# Patient Record
Sex: Male | Born: 1965 | Race: White | Hispanic: No | Marital: Single | Smoking: Never smoker
Health system: Southern US, Community
[De-identification: ages and names within clinical notes are randomized; demographics above are authoritative.]

## PROBLEM LIST (undated history)

## (undated) DIAGNOSIS — N200 Calculus of kidney: Secondary | ICD-10-CM

## (undated) HISTORY — PX: TONSILLECTOMY: SUR1361

## (undated) HISTORY — PX: HAND SURGERY: SHX662

## (undated) HISTORY — DX: Calculus of kidney: N20.0

---

## 2020-12-07 ENCOUNTER — Ambulatory Visit (INDEPENDENT_AMBULATORY_CARE_PROVIDER_SITE_OTHER): Payer: 59

## 2020-12-07 ENCOUNTER — Other Ambulatory Visit: Payer: Self-pay

## 2020-12-07 ENCOUNTER — Ambulatory Visit
Admission: EM | Admit: 2020-12-07 | Discharge: 2020-12-07 | Disposition: A | Discharge status: Home / Self Care | Payer: 59 | Attending: Physician Assistant | Admitting: Physician Assistant

## 2020-12-07 ENCOUNTER — Encounter: Payer: Self-pay | Admitting: Emergency Medicine

## 2020-12-07 DIAGNOSIS — R1011 Right upper quadrant pain: Secondary | ICD-10-CM | POA: Diagnosis present

## 2020-12-07 DIAGNOSIS — K760 Fatty (change of) liver, not elsewhere classified: Secondary | ICD-10-CM | POA: Diagnosis not present

## 2020-12-07 DIAGNOSIS — R0781 Pleurodynia: Secondary | ICD-10-CM

## 2020-12-07 DIAGNOSIS — R61 Generalized hyperhidrosis: Secondary | ICD-10-CM | POA: Insufficient documentation

## 2020-12-07 LAB — CBC WITH DIFFERENTIAL/PLATELET
Abs Immature Granulocytes: 0.04 10*3/uL (ref 0.00–0.07)
Basophils Absolute: 0.1 10*3/uL (ref 0.0–0.1)
Basophils Relative: 0 %
Eosinophils Absolute: 0.2 10*3/uL (ref 0.0–0.5)
Eosinophils Relative: 1 %
HCT: 51.6 % (ref 39.0–52.0)
Hemoglobin: 17.7 g/dL — ABNORMAL HIGH (ref 13.0–17.0)
Immature Granulocytes: 0 %
Lymphocytes Relative: 19 %
Lymphs Abs: 2.5 10*3/uL (ref 0.7–4.0)
MCH: 29.9 pg (ref 26.0–34.0)
MCHC: 34.3 g/dL (ref 30.0–36.0)
MCV: 87.2 fL (ref 80.0–100.0)
Monocytes Absolute: 1.4 10*3/uL — ABNORMAL HIGH (ref 0.1–1.0)
Monocytes Relative: 11 %
Neutro Abs: 9 10*3/uL — ABNORMAL HIGH (ref 1.7–7.7)
Neutrophils Relative %: 69 %
Platelets: 287 10*3/uL (ref 150–400)
RBC: 5.92 MIL/uL — ABNORMAL HIGH (ref 4.22–5.81)
RDW: 12.2 % (ref 11.5–15.5)
WBC: 13.1 10*3/uL — ABNORMAL HIGH (ref 4.0–10.5)
nRBC: 0 % (ref 0.0–0.2)

## 2020-12-07 LAB — COMPREHENSIVE METABOLIC PANEL
ALT: 30 U/L (ref 0–44)
AST: 21 U/L (ref 15–41)
Albumin: 4.7 g/dL (ref 3.5–5.0)
Alkaline Phosphatase: 63 U/L (ref 38–126)
Anion gap: 10 (ref 5–15)
BUN: 14 mg/dL (ref 6–20)
CO2: 27 mmol/L (ref 22–32)
Calcium: 9.6 mg/dL (ref 8.9–10.3)
Chloride: 97 mmol/L — ABNORMAL LOW (ref 98–111)
Creatinine, Ser: 0.89 mg/dL (ref 0.61–1.24)
GFR, Estimated: >60 mL/min (ref >60)
Glucose, Bld: 128 mg/dL — ABNORMAL HIGH (ref 70–99)
Potassium: 4 mmol/L (ref 3.5–5.1)
Sodium: 134 mmol/L — ABNORMAL LOW (ref 135–145)
Total Bilirubin: 1 mg/dL (ref 0.3–1.2)
Total Protein: 8.9 g/dL — ABNORMAL HIGH (ref 6.5–8.1)

## 2020-12-07 LAB — LIPASE, BLOOD: Lipase: 53 U/L — ABNORMAL HIGH (ref 11–51)

## 2020-12-07 MED ORDER — KETOROLAC TROMETHAMINE 10 MG PO TABS: 10.0000 mg | ORAL_TABLET | Freq: Four times a day (QID) | ORAL | 0 refills | Status: DC | PRN

## 2020-12-07 NOTE — ED Triage Notes (Signed)
Pt is present today with night sweats and also rib pain. Pt states that he is having a had time taking a deep breath due to right side rib pain Pt states that he noticed the pain Sunday night.

## 2020-12-07 NOTE — Discharge Instructions (Addendum)
Your x-ray was normal today. The ultrasound of your upper abdomen is negative for gallstones, but you do have a fatty liver. Try to stop drinking alcohol and reduce fat and carbs in your diet. You will likely get cholesterol check and check for diabetes with new PCP. If you keep having night sweats, may need thyroid checked as well/  The labs are all reassuring. Slightly elevated white blood cell count so that is why we obtained the ultrasound. If your pain worsens or you develop a fever or vomiting, dark stools, weakness, you should go to the ED. Otherwise please follow up with your PCP next month. It is symptom control at this time. I have sent something for pain. Rest and increase your fluids as well.

## 2020-12-07 NOTE — ED Provider Notes (Signed)
[facility]    CSN: [id number] Arrival date & time: 12/07/20  [id number]      History   Chief Complaint No chief complaint on file.   HPI Ray Snyder is a 55 y.o. male presenting for 3 night history of right-sided rib pain that radiates to the right upper back as well as night sweats and cramping of his chest and upper abdomen.  Patient says that his symptoms mostly only occur at night, but he has the "right-sided rib pain" during the day as well.  He denies any associated fevers or fatigue.  He denies any coughing or difficulty breathing.  He says that the pain of the ribs is made worse when he takes deep breaths.  He says he has tried to do some stretches to relieve the discomfort but it does not really help.  No increased pain to palpation of this area.  No vomiting, diarrhea or constipation. No lower back pain, dysuria, urinary frequency, flank pain.  No sick contacts.  Has not taken any OTC medications to help with the discomfort.  Patient denies history of asthma or COPD.  Non-smoker.  No history of cardiac issues or GI problems either.  No other concerns.  HPI  History reviewed. No pertinent past medical history.  There are no problems to display for this patient.   History reviewed. No pertinent surgical history.     Home Medications    Prior to Admission medications   Medication Sig Start Date End Date Taking? Authorizing Provider  ketorolac (TORADOL) 10 MG tablet Take 1 tablet (10 mg total) by mouth every 6 (six) hours as needed. 12/07/20  Yes Shirlee LatchEaves, Addley Ballinger B, PA-C    Family History History reviewed. No pertinent family history.  Social History Social History   Tobacco Use  . Smoking status: Never Smoker  . Smokeless tobacco: Never Used  Substance Use Topics  . Alcohol use: Yes    Comment: rarely   . Drug use: Never     Allergies   Codeine   Review of Systems Review of Systems  Constitutional: Positive for diaphoresis. Negative for fatigue and  fever.  HENT: Negative for congestion, rhinorrhea, sinus pressure, sinus pain and sore throat.   Respiratory: Negative for cough, shortness of breath and wheezing.   Cardiovascular: Positive for chest pain. Negative for palpitations.  Gastrointestinal: Positive for abdominal pain and nausea. Negative for diarrhea and vomiting.  Genitourinary: Negative for dysuria, flank pain and hematuria.  Musculoskeletal: Positive for back pain (upper back). Negative for myalgias.  Neurological: Negative for weakness, light-headedness and headaches.  Hematological: Negative for adenopathy.     Physical Exam Triage Vital Signs ED Triage Vitals  Enc Vitals Group     BP 12/07/20 0837 (!) 158/94     Pulse Rate 12/07/20 0837 71     Resp 12/07/20 0837 17     Temp 12/07/20 0837 98.4 F (36.9 C)     Temp Source 12/07/20 0837 Oral     SpO2 12/07/20 0837 98 %     Weight --      Height --      Head Circumference --      Peak Flow --      Pain Score 12/07/20 0834 8     Pain Loc --      Pain Edu? --      Excl. in GC? --    No data found.  Updated Vital Signs BP (!) 158/94 (BP Location: Left Arm)  Pulse 71   Temp 98.4 F (36.9 C) (Oral)   Resp 17   SpO2 98%       Physical Exam Vitals and nursing note reviewed.  Constitutional:      General: He is not in acute distress.    Appearance: Normal appearance. He is well-developed. He is not ill-appearing or diaphoretic.  HENT:     Head: Normocephalic and atraumatic.     Mouth/Throat:     Pharynx: Uvula midline.     Tonsils: No tonsillar abscesses.  Eyes:     General: No scleral icterus.       Right eye: No discharge.        Left eye: No discharge.     Conjunctiva/sclera: Conjunctivae normal.  Neck:     Thyroid: No thyromegaly.     Trachea: No tracheal deviation.  Cardiovascular:     Rate and Rhythm: Normal rate and regular rhythm.     Heart sounds: Normal heart sounds.  Pulmonary:     Effort: Pulmonary effort is normal. No respiratory  distress.     Breath sounds: Normal breath sounds. No wheezing, rhonchi or rales.  Chest:     Chest wall: No tenderness.  Abdominal:     General: Bowel sounds are normal.     Palpations: Abdomen is soft.     Tenderness: There is abdominal tenderness in the right upper quadrant and epigastric area. There is no right CVA tenderness or left CVA tenderness. Positive signs include Murphy's sign.  Musculoskeletal:     Cervical back: Normal range of motion and neck supple.  Lymphadenopathy:     Cervical: No cervical adenopathy.  Skin:    General: Skin is warm and dry.     Findings: No rash.  Neurological:     General: No focal deficit present.     Mental Status: He is alert. Mental status is at baseline.     Motor: No weakness.     Gait: Gait normal.  Psychiatric:        Mood and Affect: Mood normal.        Behavior: Behavior normal.        Thought Content: Thought content normal.      UC Treatments / Results  Labs (all labs ordered are listed, but only abnormal results are displayed) Labs Reviewed  CBC WITH DIFFERENTIAL/PLATELET - Abnormal; Notable for the following components:      Result Value   WBC 13.1 (*)    RBC 5.92 (*)    Hemoglobin 17.7 (*)    Neutro Abs 9.0 (*)    Monocytes Absolute 1.4 (*)    All other components within normal limits  COMPREHENSIVE METABOLIC PANEL - Abnormal; Notable for the following components:   Sodium 134 (*)    Chloride 97 (*)    Glucose, Bld 128 (*)    Total Protein 8.9 (*)    All other components within normal limits  LIPASE, BLOOD - Abnormal; Notable for the following components:   Lipase 53 (*)    All other components within normal limits    EKG   Radiology DG Ribs Unilateral W/Chest Right  Result Date: 12/07/2020 CLINICAL DATA:  Right-sided rib pain and night sweats.  No injury. EXAM: RIGHT RIBS AND CHEST - 3+ VIEW COMPARISON:  None. FINDINGS: No fracture or other bone lesions are seen involving the ribs. There is no evidence of  pneumothorax or pleural effusion. Both lungs are clear. Heart size and mediastinal contours are within normal  limits. IMPRESSION: Negative. Electronically Signed   By: Obie Dredge M.D.   On: 12/07/2020 09:23   US Abdomen Limited RUQ (LIVER/GB)  Result Date: 12/07/2020 CLINICAL DATA:  Right upper quadrant pain for 4 days. EXAM: ULTRASOUND ABDOMEN LIMITED RIGHT UPPER QUADRANT COMPARISON:  None. FINDINGS: Gallbladder: No gallstones or wall thickening visualized. No sonographic Murphy sign noted by sonographer. Common bile duct: Diameter: 3 mm, within normal limits. Liver: Diffusely increased parenchymal echogenicity, consistent with hepatic steatosis. Focal fatty sparing seen adjacent to the gallbladder fossa. However, there is another hypoechoic lesion in the posterior right lobe which measures 2.0 x 1.5 by 1.9 cm, and does not have characteristics of a simple cyst. Portal vein is patent on color Doppler imaging with normal direction of blood flow towards the liver. Other: None. IMPRESSION: No evidence of cholelithiasis or biliary ductal dilatation. Diffuse hepatic steatosis. 2 cm indeterminate hypoechoic liver lesion in posterior right hepatic lobe. Abdomen MRI without and with contrast is recommended for further characterization. Electronically Signed   By: Danae Orleans M.D.   On: 12/07/2020 11:58    Procedures Procedures (including critical care time)  Medications Ordered in UC Medications - No data to display  Initial Impression / Assessment and Plan / UC Course  I have reviewed the triage vital signs and the nursing notes.  Pertinent labs & imaging results that were available during my care of the patient were reviewed by me and considered in my medical decision making (see chart for details).   55 year old male presenting for right sided rib pain and night sweats as well as nausea and muscle cramping at night.  Blood pressure is elevated 158/94.  He is afebrile and well-appearing.  Exam is  significant for right upper quadrant tenderness and positive Murphy sign.  No tenderness to palpation of chest or back.  No CVA tenderness. Heart is regular rate and rhythm and chest is clear to auscultation.  No skin rashes that could be consistent with shingles.  Right rib x-ray and chest x-ray obtained today due to concern for pneumonia, pleural effusion, small pneumothorax. Images reviewed by me.  X-rays are normal.  CBC, CMP and lipase obtained.  Concerned about possible cholecystitis, cholelithiasis and pancreatitis given his right upper quadrant and epigastric tenderness as well as positive Murphy sign.  WBC is slightly elevated at 13.1.  Lipase is 53 which is just slightly above normal.  Additionally he does have elevated glucose at 128.  Patient says that he has not eaten eaten anything today.  Ultrasound shows hepatic steatosis. Patient says he used to drink alcohol heavily but does not anymore. Drinks every now and then now.  Advised him to try to stop drinking completely and to also watch his diet.  Advised avoiding intake of fatty foods and excess carbs.  I am sure he will have lipid panel performed at his upcoming appointment.  Reviewed [place] results with patient. Advised no cholecystitis/cholelithiasis.  Also reviewed the lab results.  Since he has not eaten anything today and does have a elevated glucose, advised that it is possible he could have underlying diabetes or prediabetes.  He has an upcoming appointment with Dr. Judithann Graves next month so advised him to have lab work done at that time.  Reviewed that the lipase is slightly elevated so if he has any increased epigastric pain then he needs to go to the emergency department.  Reviewed ED precautions for any worsening symptoms.  Advised to go to the ED if he develops any  increased abdominal pain, fever, weakness or any new symptoms such as lower back pain or flank pain or urinary symptoms.  It is possible this could be an abnormal  presentation for a kidney stone.  Also concerning is the night sweats.  This could be due to infection or possibly even endocrine problem.  He does have appt with PCP Dr. Judithann Graves at the end of next month. Advised to keep that and see if he can get in sooner.   Supportive care at this time. Sent ketorolac for pain.  Also advised that he could take low-dose Tylenol and apply ice to the area that hurts.  This could all be musculoskeletal pain.  Nonetheless, I did caution him on going to ED for any worsening symptoms.   Final Clinical Impressions(s) / UC Diagnoses   Final diagnoses:  Abdominal pain, RUQ (right upper quadrant)  Night sweats  Fatty liver  Rib pain on right side     Discharge Instructions     Your x-ray was normal today. The ultrasound of your upper abdomen is negative for gallstones, but you do have a fatty liver. Try to stop drinking alcohol and reduce fat and carbs in your diet. You will likely get cholesterol check and check for diabetes with new PCP. If you keep having night sweats, may need thyroid checked as well/  The labs are all reassuring. Slightly elevated white blood cell count so that is why we obtained the ultrasound. If your pain worsens or you develop a fever or vomiting, dark stools, weakness, you should go to the ED. Otherwise please follow up with your PCP next month. It is symptom control at this time. I have sent something for pain. Rest and increase your fluids as well.     ED Prescriptions    Medication Sig Dispense Auth. Provider   ketorolac (TORADOL) 10 MG tablet Take 1 tablet (10 mg total) by mouth every 6 (six) hours as needed. 20 tablet Shirlee Latch, PA-C     I have reviewed the PDMP during this encounter.   Shirlee Latch, PA-C 12/07/20 1623

## 2020-12-21 DIAGNOSIS — N201 Calculus of ureter: Secondary | ICD-10-CM | POA: Insufficient documentation

## 2020-12-21 DIAGNOSIS — R911 Solitary pulmonary nodule: Secondary | ICD-10-CM | POA: Insufficient documentation

## 2020-12-21 HISTORY — PX: CYSTOURETHROSCOPY: SHX476

## 2021-01-12 ENCOUNTER — Telehealth: Payer: Self-pay

## 2021-01-12 NOTE — Telephone Encounter (Signed)
Copied from CRM [id number]. Topic: General - Other >> Jan 12, 2021 10:02 AM Daphine Deutscher D wrote: Reason for CRM: Raynelle Fanning from [facility] radiology called with a report for patient.    CXR DOS May 19th  Findings,  Rt lower lung mass.  Left lower lung nodule.  Recommending chest CT to follow.   They are faxing his report and have notified pt of findings and FU.     CB#  Raynelle Fanning [phone]

## 2021-01-12 NOTE — Telephone Encounter (Signed)
Note is in pt chart. Pt is not  established yet. Pt has a new pt appt 01/31/2021.  KP

## 2021-01-31 ENCOUNTER — Encounter: Payer: Self-pay | Admitting: Internal Medicine

## 2021-01-31 ENCOUNTER — Other Ambulatory Visit: Payer: Self-pay

## 2021-01-31 ENCOUNTER — Ambulatory Visit (INDEPENDENT_AMBULATORY_CARE_PROVIDER_SITE_OTHER): Payer: 59 | Admitting: Internal Medicine

## 2021-01-31 VITALS — BP 136/80 | HR 63 | Temp 98.1°F | Ht 72.0 in | Wt 217.0 lb

## 2021-01-31 DIAGNOSIS — R918 Other nonspecific abnormal finding of lung field: Secondary | ICD-10-CM | POA: Diagnosis not present

## 2021-01-31 DIAGNOSIS — R911 Solitary pulmonary nodule: Secondary | ICD-10-CM | POA: Diagnosis not present

## 2021-01-31 DIAGNOSIS — Z87442 Personal history of urinary calculi: Secondary | ICD-10-CM

## 2021-01-31 NOTE — Progress Notes (Signed)
Date:  01/31/2021   Name:  Ray Snyder   DOB:  [birth year]   MRN:  [id number]   Chief Complaint: Establish Care (X 3 weeks ago lung mass/liver lesion from urologist went to UC and had Pneumonia, went to hospital [facility] and coughed up blood while in hospital they may have found something in liver, kidney stones )  HPI He currently feels well.  He has not coughed up any more blood.  He denies SOB or cough.  He does have intermittent discomfort between his shoulder blades.  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 He removed his ureteral stents.  He will follow up with Urology on 02/10/21 for KUB and [place].  They have also arranged for a repeat CT of the chest at the same time.  It has already been approved by his insurance. He just needs to call and find out what time to arrive at Radiology at [facility]. He has not had a PCP or CPX in many years.  Due for Colonoscopy.  12/23/20 Discharge note from [facility].  Summary.  # Hemoptysis # Concern for left lower lobe pneumonia # Mass like opacity in R lower lobe Additional mass like opacity seen on CXR. Given his infection symptoms over the past two weeks, I continue to favor this is due to pneumonia, possibly post-viral, given severe symptoms 2 weeks ago. Recommend completing CAP course for treatment and with close f/u with CT chest imaging. Hemoptysis has since resolved.  - can discharge on amoxicillin 1 gm tid and azithromycin 500 mg daily to complete a total of 5 days course - recommend close f/u CT chest in a few weeks given also new R opacity as well  - PCP f/u - has one arranged for June 28th outside [facility]. I have placed an ASAP PCP referral to expedite sooner follow up.  - okay to discharge from medical standpoint.  CT Abd/Pelvic 12/20/20 at [facility]: CT abdomen and pelvis with IV contrast  Comparison:  None available.  Indication:  Abdominal pain, acute, nonlocalized, R10.32 Left lower  quadrant pain.  Technique:  CT imaging was performed of  the abdomen and pelvis following  the administration of intravenous contrast.  Iodinated contrast was used  due to the indications for the examination, to improve disease detection  and further define anatomy. Coronal and sagittal reformatted images were  generated and reviewed.  Findings:  - Lower Thorax: Nodular opacity in the left lower lung (3/22) measuring 2.5  x 1.9 cm. No pleural or pericardial effusions.  - Liver: Liver cyst. No suspicious liver lesions. Additional scattered  hypoattenuating foci in the liver which are too small to characterize.  Hepatic veins are patent. Portal veins are patent.  - Biliary and Gallbladder: No intrahepatic or extrahepatic bile duct  dilatation. Normal gallbladder.  - Spleen: Normal spleen.  - Pancreas: Normal pancreas.  - Adrenal Glands: Normal adrenal glands.  - Kidneys: Mild delayed left nephrogram. Bilateral renal cysts. No  suspicious renal lesions. Tiny 2 mm obstructing stone in the distal left  ureter (3/1 3 3) with mild upstream hydroureteronephrosis and periureteral  stranding. There is a small amount of fluid surrounding the proximal ureter  on the initial images, and the patient was brought back for delayed images  which demonstrate contrast extravasation into the left retroperitoneum near  the region of the left ureteropelvic junction (UPJ) (12/66).  - Abdominal and Pelvic Vasculature: No abdominal aortic aneurysm. Mild  vascular calcification.  -  Gastrointestinal Tract: No abnormal dilation or wall thickening.  Diverticulosis without diverticulitis. Normal appendix.  - Peritoneum/Mesentery/Retroperitoneum: No free fluid.  No free  intraperitoneal air.  - Lymph Nodes: No retroperitoneal, mesenteric, pelvic, or inguinal  lymphadenopathy.    - Bladder: Normal bladder.  - Pelvic Organs: Unremarkable.  - Body Wall: Unremarkable.  - Musculoskeletal:  No aggressive appearing osseous lesions.  Impression:  1. Approximately 2 mm  obstructing stone in the distal left ureter  complicated by mild left hydroureteronephrosis and extravasation of  contrast from the proximal ureteropelvic junction (UPJ) region. Small  volume urine in the left retroperitoneum, which is increased on the delayed  images compared with the initial images. No definite contrast is seen in  the distal left ureter beyond the stone.  2. Focal consolidation in the left lung base which may be infectious in  etiology. Recommend 3 month Chest CT follow up after treatment to exclude  neoplasm.   Findings discussed with PA Wolick by Donley Redder, MD, [facility]  at 12/20/2020 2:21 PM. Discussion of delayed images confirming rupture  discussed at 3:30 PM.    CXR 12/22/20 at [facility]: Procedure: Frontal and lateral views of the chest.  Indication: Lung Aeration, R91.1 Solitary pulmonary nodule  Comparison: None.  Findings and impression:  1. Masslike opacity projecting in the superior segment of the right lower  lobe. Differential considerations include infection and neoplasm.  2. Irregular nodular opacity seen in the left lower lobe on recent  abdominal CT not well visualized on this radiograph.  3. No evidence of pneumothorax or pleural effusion.  4. Cardiac and mediastinal contours within normal limits.  UNEXPECTED FINDING: Masslike opacity projecting in the right lung, in  addition to faintly identified left lower lobe nodular opacity identified  on previous abdominal CT. As the left lower lobe lesion is poorly seen on  radiography, a follow-up CT of the chest is recommended, either now for  full evaluation of the right lung opacity or, possibly, after treatment for  infection.  Electronically Signed by:  Dierdre Forth, MD, [facility]  Electronically Signed on:  12/23/2020 8:10 AM Lab Results  Component Value Date   CREATININE 0.89 12/07/2020   BUN 14 12/07/2020   NA 134 (L) 12/07/2020   K 4.0 12/07/2020   CL 97 (L) 12/07/2020    CO2 27 12/07/2020   No results found for: CHOL, HDL, LDLCALC, LDLDIRECT, TRIG, CHOLHDL No results found for: TSH No results found for: HGBA1C Lab Results  Component Value Date   WBC 13.1 (H) 12/07/2020   HGB 17.7 (H) 12/07/2020   HCT 51.6 12/07/2020   MCV 87.2 12/07/2020   PLT 287 12/07/2020   Lab Results  Component Value Date   ALT 30 12/07/2020   AST 21 12/07/2020   ALKPHOS 63 12/07/2020   BILITOT 1.0 12/07/2020     Review of Systems  Constitutional:  Negative for appetite change, fatigue, fever and unexpected weight change.  Respiratory:  Negative for cough, chest tightness, shortness of breath and wheezing.   Cardiovascular:  Positive for chest pain (intermittent pain between shoulder blades). Negative for palpitations and leg swelling.  Gastrointestinal:  Negative for abdominal pain, blood in stool, constipation and diarrhea.  Genitourinary:  Negative for dysuria, flank pain and hematuria.  Musculoskeletal:  Positive for arthralgias (in wrists s/p fall recently with sprain).  Skin:  Negative for rash.  Neurological:  Negative for dizziness, tremors, weakness and headaches.  Psychiatric/Behavioral:  Negative for dysphoric mood and sleep disturbance.  The patient is not nervous/anxious.    There are no problems to display for this patient.   Allergies  Allergen Reactions   Codeine Nausea And Vomiting    Past Surgical History:  Procedure Laterality Date   CYSTOURETHROSCOPY Bilateral 12/21/2020   with stent placement   HAND SURGERY     TONSILLECTOMY      Social History   Tobacco Use   Smoking status: Never   Smokeless tobacco: Never  Vaping Use   Vaping Use: Never used  Substance Use Topics   Alcohol use: Yes    Comment: rarely    Drug use: Never     Medication list has been reviewed and updated.  No outpatient medications have been marked as taking for the 01/31/21 encounter (Office Visit) with Reubin Milan, MD.    [facility] 2/9 Scores 01/31/2021   PHQ - 2 Score 0  PHQ- 9 Score 0    GAD 7 : Generalized Anxiety Score 01/31/2021  Nervous, Anxious, on Edge 0  Control/stop worrying 0  Worry too much - different things 0  Trouble relaxing 0  Restless 0  Easily annoyed or irritable 0  Afraid - awful might happen 0  Total GAD 7 Score 0    BP Readings from Last 3 Encounters:  01/31/21 136/80  12/07/20 (!) 158/94    Physical Exam Constitutional:      Appearance: Normal appearance.  Neck:     Vascular: No carotid bruit.  Cardiovascular:     Rate and Rhythm: Normal rate and regular rhythm.     Pulses: Normal pulses.     Heart sounds: No murmur heard. Pulmonary:     Effort: Pulmonary effort is normal.     Breath sounds: Normal breath sounds. No wheezing or rhonchi.  Musculoskeletal:     Cervical back: Normal range of motion.     Right lower leg: No edema.     Left lower leg: No edema.  Lymphadenopathy:     Cervical: No cervical adenopathy.  Skin:    Findings: Rash (poison oak on both ankles) present.  Neurological:     General: No focal deficit present.     Mental Status: He is alert.  Psychiatric:        Mood and Affect: Mood normal.        Behavior: Behavior normal.    Wt Readings from Last 3 Encounters:  01/31/21 217 lb (98.4 kg)    BP 136/80   Pulse 63   Temp 98.1 F (36.7 C) (Oral)   Ht 6' (1.829 m)   Wt 217 lb (98.4 kg)   SpO2 96%   BMI 29.43 kg/m   Assessment and Plan: 1. Right lower lobe lung mass Repeat CT of the chest scheduled at [facility] on 02/10/21 Urology has arranged this and will follow up with him on the findings  2. Nodule of lower lobe of left lung Seen on the abd/pelvic CT but not especially evident on CXR This will be re-evaluated on the CT next week  3. History of kidney stones Being followed by [facility]  Recommend follow up CPX, labs and health maintenance exam in 4-6 months Partially dictated using Dragon software. Any errors are unintentional.  Bari Edward, MD [facility]  01/31/2021

## 2021-06-28 ENCOUNTER — Encounter: Payer: 59 | Admitting: Internal Medicine

## 2021-09-04 ENCOUNTER — Encounter: Payer: Self-pay | Admitting: Internal Medicine

## 2022-06-03 IMAGING — CR DG RIBS W/ CHEST 3+V*R*
5 series · 5 of 5 positions shown · non-contrast
Comparison: None.

CLINICAL DATA: Right-sided rib pain and night sweats.  No injury.

EXAM:
RIGHT RIBS AND CHEST - 3+ VIEW

[chest pa]
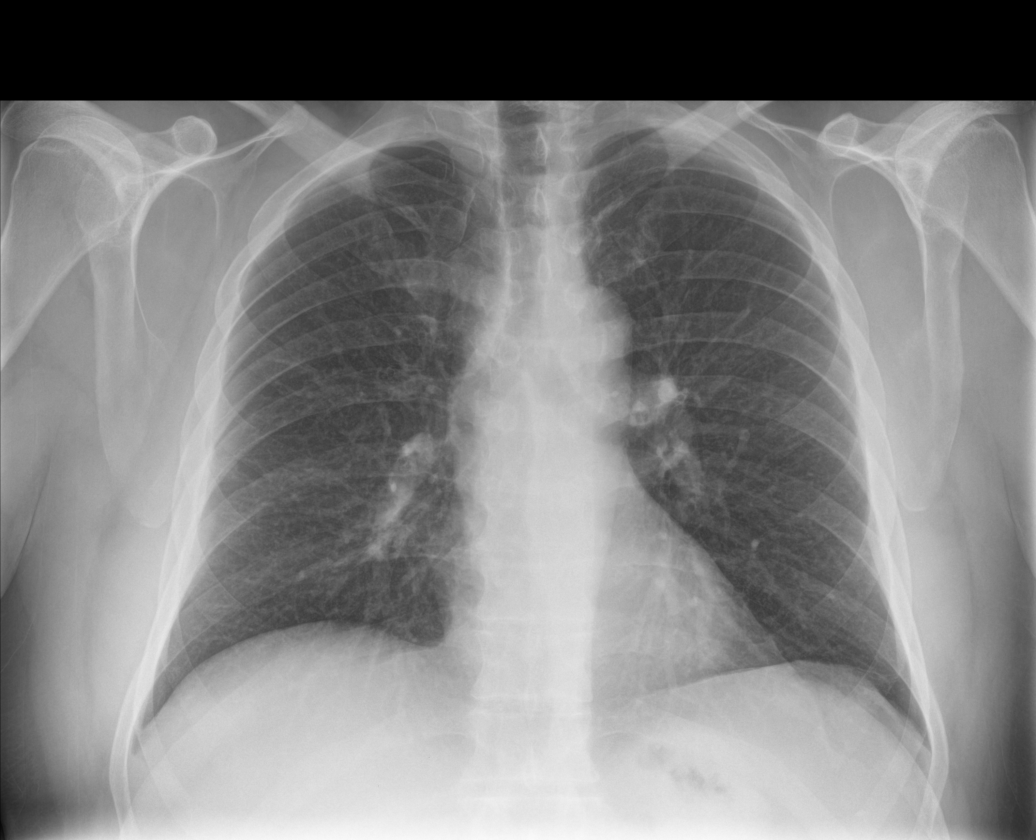

[rib pa (1 of 2)]
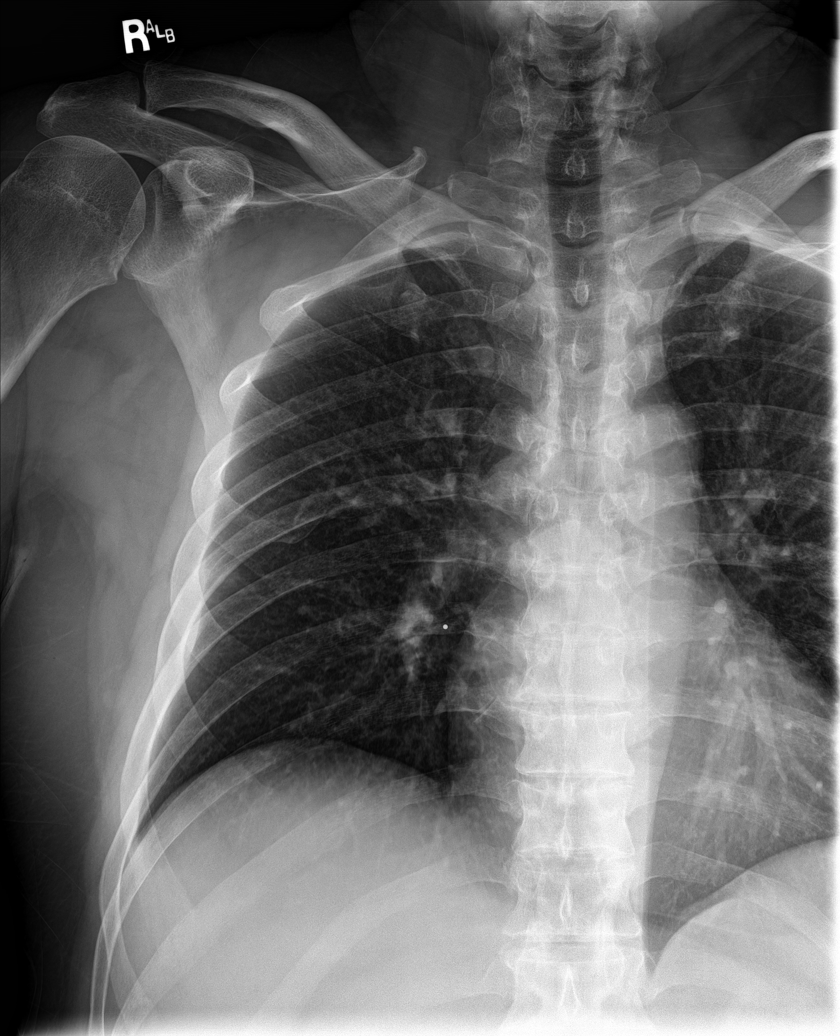

[rib pa (2 of 2)]
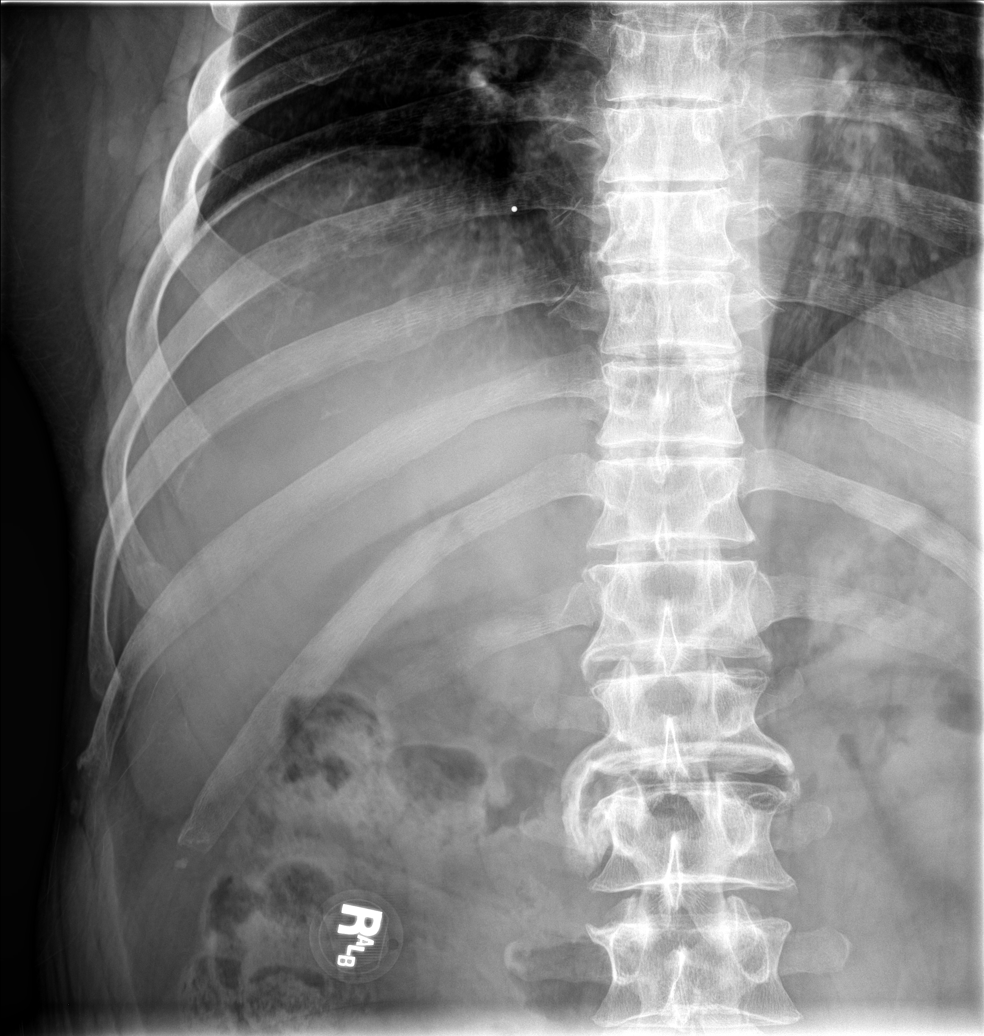

[rib obl (1 of 2)]
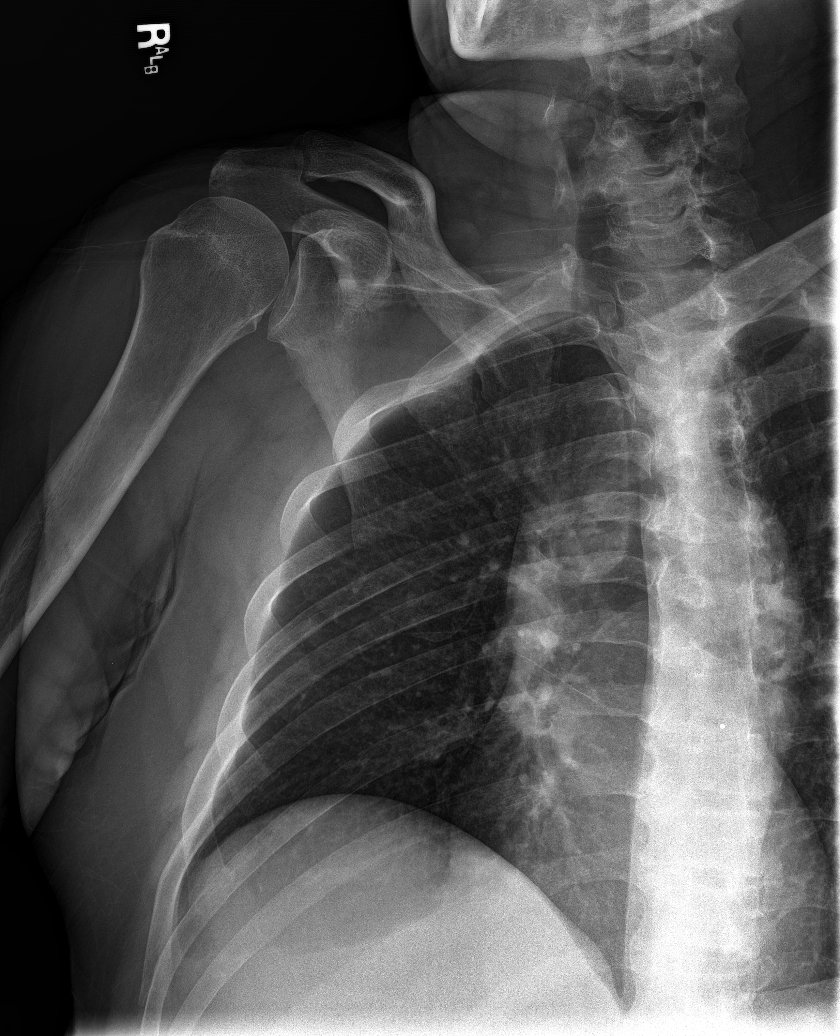

[rib obl (2 of 2)]
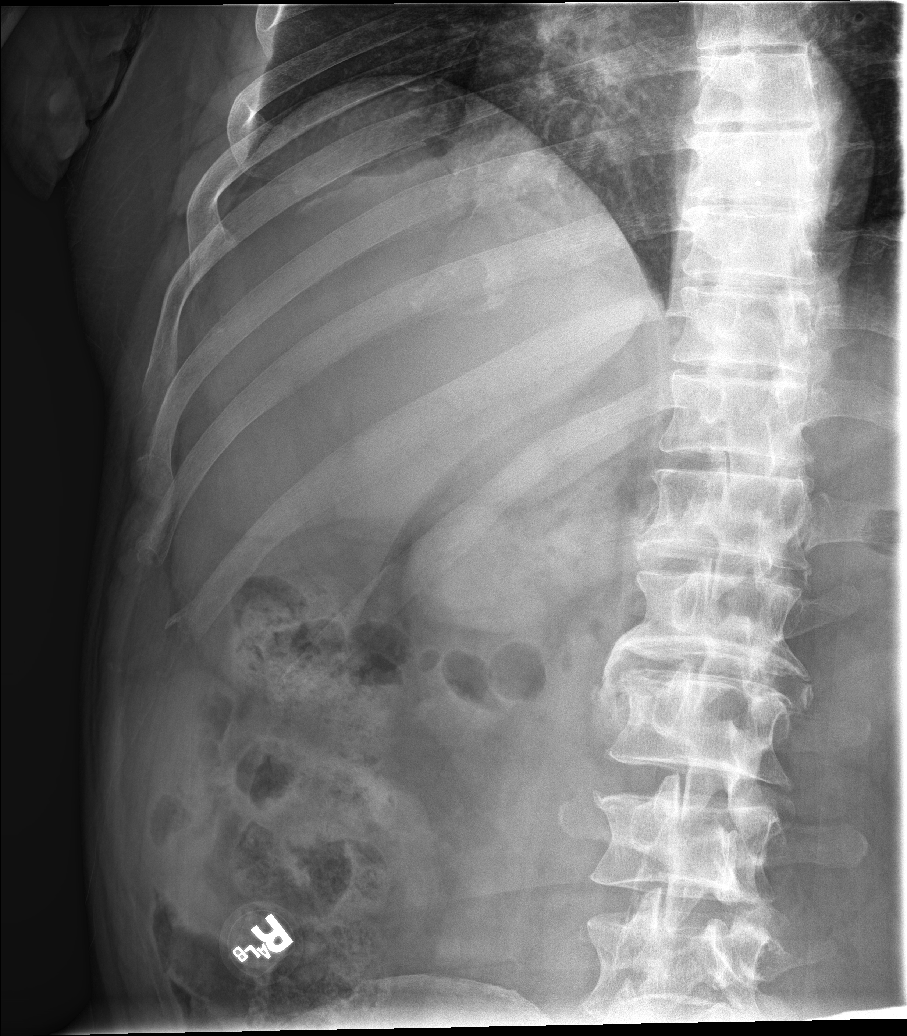

[5 of 5 positions shown; findings below may reference images not displayed]

FINDINGS: No fracture or other bone lesions are seen involving the ribs. There
is no evidence of pneumothorax or pleural effusion. Both lungs are
clear. Heart size and mediastinal contours are within normal limits.
IMPRESSION: Negative.

## 2024-08-03 ENCOUNTER — Ambulatory Visit: Payer: Self-pay

## 2024-08-03 NOTE — Telephone Encounter (Signed)
 Please review.  KP

## 2024-08-03 NOTE — Telephone Encounter (Signed)
 Pt still wants to keep the 1/6 appt. Pt stated its not getting any worse its just not getting better. Pt will go to UC if it gets worse.  KP

## 2024-08-03 NOTE — Telephone Encounter (Signed)
" °  FYI Only or Action Required?: FYI only for provider: appointment scheduled on 08/11/2024.  Patient was last seen in primary care on 01/31/2021.  Called Nurse Triage reporting Back Pain.  Symptoms began a week ago.  Interventions attempted: OTC medications: motrin.  Symptoms are: unchanged.  Triage Disposition: See HCP Within 4 Hours (Or PCP Triage)  Patient/caregiver understands and will follow disposition?: Yes Copied from CRM #[id number]. Topic: Clinical - Red Word Triage >> Aug 03, 2024 10:26 AM Gustabo D wrote: Pt says he pulled something in his back and he is hurting. He says his lower back is killing him and he feels it in his right side and hip. Started last Tuesday while he was working he made a funny move and as the night when on it got worse started tightening up and not easing up. >> Aug 03, 2024 10:33 AM Gustabo D wrote: While holding pt says his back has been hurting before at least a week maybe more. Reason for Disposition  [1] SEVERE back pain (e.g., excruciating, unable to do any normal activities) AND [2] not improved 2 hours after pain medicine  Answer Assessment - Initial Assessment Questions 1. ONSET: When did the pain begin? (e.g., minutes, hours, days)     One week ago 2. LOCATION: Where does it hurt? (upper, mid or lower back)     Right low back radiating to hip 3. SEVERITY: How bad is the pain?  (e.g., Scale 1-10; mild, moderate, or severe)     10/10 with certain movements 4. PATTERN: Is the pain constant? (e.g., yes, no; constant, intermittent)      constant 5. RADIATION: Does the pain shoot into your legs or somewhere else?     Right hip 6. CAUSE:  What do you think is causing the back pain?      Unsure, but did feel something pull about a week ago 7. BACK OVERUSE:  Any recent lifting of heavy objects, strenuous work or exercise?     Construction for a living, physical job 8. MEDICINES: What have you taken so far for the pain? (e.g.,  nothing, acetaminophen, NSAIDS)     Motrin with minimal relief  Denies fever, pain with urination, hematuria  Protocols used: Back Pain-A-AH  "

## 2024-08-11 ENCOUNTER — Ambulatory Visit: Admitting: Physician Assistant

## 2024-08-11 ENCOUNTER — Encounter: Payer: Self-pay | Admitting: Physician Assistant

## 2024-08-11 VITALS — BP 144/90 | HR 69 | Temp 98.6°F | Ht 72.0 in | Wt 214.0 lb

## 2024-08-11 DIAGNOSIS — R030 Elevated blood-pressure reading, without diagnosis of hypertension: Secondary | ICD-10-CM

## 2024-08-11 DIAGNOSIS — M545 Low back pain, unspecified: Secondary | ICD-10-CM

## 2024-08-11 MED ORDER — CYCLOBENZAPRINE HCL 10 MG PO TABS: 10.0000 mg | ORAL_TABLET | Freq: Three times a day (TID) | ORAL | 0 refills | Status: AC | PRN

## 2024-08-11 NOTE — Progress Notes (Signed)
 "   Date:  08/11/2024   Name:  Ray Snyder   DOB:  1966-04-08   MRN:  [id number]   Chief Complaint: Establish Care and Back Pain  Back Pain This is a new problem. The current episode started 1 to 4 weeks ago. The problem occurs constantly. The problem has been gradually improving since onset. Pain location: lower back. The quality of the pain is described as aching. Radiates to: groin area right side. The pain is at a severity of 6/10. The pain is mild. The symptoms are aggravated by bending and twisting. He has tried heat, bed rest and NSAIDs for the symptoms. The treatment provided mild relief.    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 He has had a gradual recovery and seems to be improving. Pain responsive to OTC ibuprofen 400 mg BID, which he has not taken in a couple days. He was most concerned by a knot near the RLQ/groin but this also seems to be improving. Has also been using cannabis recently for the pain and muscle tension. Plans to see chiropractor soon.   Medication list has been reviewed and updated.  Active Medications[1]   Review of Systems  Musculoskeletal:  Positive for back pain.    Patient Active Problem List   Diagnosis Date Noted   Lesion of left lung 12/21/2020   Ureterolithiasis 12/21/2020    Allergies[2]   There is no immunization history on file for this patient.  Past Surgical History:  Procedure Laterality Date   CYSTOURETHROSCOPY Bilateral 12/21/2020   with stent placement   HAND SURGERY     TONSILLECTOMY      Social History[3]  Family History  Problem Relation Age of Onset   Arthritis Mother    Diabetes Brother         08/11/2024   10:25 AM 01/31/2021    3:26 PM  GAD 7 : Generalized Anxiety Score  Nervous, Anxious, on Edge 0 0  Control/stop worrying 0 0  Worry too much - different things 0 0  Trouble relaxing 0 0  Restless 0 0   Easily annoyed or irritable 0 0  Afraid - awful might happen 0 0  Total GAD 7 Score 0 0  Anxiety Difficulty Not difficult at all        08/11/2024   10:25 AM 01/31/2021    3:26 PM  Depression screen PHQ 2/9  Decreased Interest 0 0  Down, Depressed, Hopeless 0 0  PHQ - 2 Score 0 0  Altered sleeping  0  Tired, decreased energy  0  Change in appetite  0  Feeling bad or failure about yourself   0  Trouble concentrating  0  Moving slowly or fidgety/restless  0  Suicidal thoughts  0  PHQ-9 Score  0   Difficult doing work/chores  Not difficult at all     Data saved with a previous flowsheet row definition    BP Readings from Last 3 Encounters:  08/11/24 (!) 144/90  01/31/21 136/80  12/07/20 (!) 158/94    Wt Readings from Last 3 Encounters:  08/11/24 214 lb (97.1 kg)  01/31/21 217 lb (98.4 kg)    BP (!) 144/90 (BP Location: Left Arm, Cuff Size: Large)   Pulse 69   Temp 98.6 F (37 C)   Ht 6' (1.829 m)   Wt 214 lb (97.1 kg)  SpO2 96%   BMI 29.02 kg/m   Physical Exam Vitals and nursing note reviewed.  Constitutional:      Appearance: Normal appearance.  Cardiovascular:     Rate and Rhythm: Normal rate.  Pulmonary:     Effort: Pulmonary effort is normal.  Abdominal:     General: There is no distension.     Comments: No ventral or inguinal hernia appreciated (direct or indirect).   Genitourinary:    Comments: No scrotal masses.  Musculoskeletal:        General: Normal range of motion.     Comments: No midline spinal tenderness. No focal tenderness to palpation of the right lumbar musculature, right flank, RLQ, or right inguinal region.   Skin:    General: Skin is warm and dry.  Neurological:     Mental Status: He is alert and oriented to person, place, and time.     Gait: Gait is intact.  Psychiatric:        Mood and Affect: Mood and affect normal.     Recent Labs     Component Value Date/Time   NA 134 (L) 12/07/2020 0926   K 4.0 12/07/2020 0926   CL  97 (L) 12/07/2020 0926   CO2 27 12/07/2020 0926   GLUCOSE 128 (H) 12/07/2020 0926   BUN 14 12/07/2020 0926   CREATININE 0.89 12/07/2020 0926   CALCIUM 9.6 12/07/2020 0926   PROT 8.9 (H) 12/07/2020 0926   ALBUMIN 4.7 12/07/2020 0926   AST 21 12/07/2020 0926   ALT 30 12/07/2020 0926   ALKPHOS 63 12/07/2020 0926   BILITOT 1.0 12/07/2020 0926   GFRNONAA >60 12/07/2020 0926    Lab Results  Component Value Date   WBC 13.1 (H) 12/07/2020   HGB 17.7 (H) 12/07/2020   HCT 51.6 12/07/2020   MCV 87.2 12/07/2020   PLT 287 12/07/2020   No results found for: HGBA1C No results found for: CHOL, HDL, LDLCALC, LDLDIRECT, TRIG, CHOLHDL No results found for: TSH    Assessment and Plan:  1. Acute right-sided low back pain without sciatica (Primary) Seems to be recovering nicely. Try cyclobenzaprine  to further ease muscle tension. Return to work Thursday. Relative rest advised. Continue ibuprofen PRN.   - cyclobenzaprine  (FLEXERIL ) 10 MG tablet; Take 1 tablet (10 mg total) by mouth 3 (three) times daily as needed for muscle spasms.  Dispense: 30 tablet; Refill: 0  2. Elevated blood pressure reading in office without diagnosis of hypertension Possibly attributed to pain or recent use of NSAID or cannabis. Does not monitor at home. Has not been seen here in 3y. For now, no intervention. Consider home BP monitoring. Return in 81m to reassess.     F/u 66m CPE w/ labs   Rolan Hoyle, PA-C, DMSc, DipACLM, Nutritionist [facility] [phone]      [1]  Current Meds  Medication Sig   cyclobenzaprine  (FLEXERIL ) 10 MG tablet Take 1 tablet (10 mg total) by mouth 3 (three) times daily as needed for muscle spasms.   ibuprofen (ADVIL) 200 MG tablet Take 200 mg by mouth every 6 (six) hours as needed for mild pain (pain score 1-3).  [2]  Allergies Allergen Reactions   Codeine Nausea And Vomiting  [3]   Social History Tobacco Use   Smoking status: Never   Smokeless tobacco: Never  Vaping Use   Vaping status: Never Used  Substance Use Topics   Alcohol use: Yes  Comment: rarely    Drug use: Never   "

## 2024-10-12 ENCOUNTER — Encounter: Admitting: Physician Assistant

## 2024-10-21 ENCOUNTER — Encounter: Admitting: Physician Assistant
